# Patient Record
Sex: Female | Born: 1975 | Race: White | Hispanic: No | Marital: Married | Smoking: Never smoker
Health system: Southern US, Community
[De-identification: ages and names within clinical notes are randomized; demographics above are authoritative.]

---

## 2004-01-04 ENCOUNTER — Other Ambulatory Visit: Admission: RE | Admit: 2004-01-04 | Discharge: 2004-01-04 | Payer: Self-pay | Admitting: Physical Therapy

## 2004-01-04 ENCOUNTER — Other Ambulatory Visit: Admission: RE | Admit: 2004-01-04 | Discharge: 2004-01-04 | Payer: Self-pay | Admitting: Obstetrics and Gynecology

## 2004-07-21 ENCOUNTER — Inpatient Hospital Stay (HOSPITAL_COMMUNITY): Admission: AD | Admit: 2004-07-21 | Discharge: 2004-07-23 | Payer: Self-pay | Admitting: Obstetrics and Gynecology

## 2004-08-24 ENCOUNTER — Other Ambulatory Visit: Admission: RE | Admit: 2004-08-24 | Discharge: 2004-08-24 | Payer: Self-pay | Admitting: Obstetrics and Gynecology

## 2005-10-09 ENCOUNTER — Other Ambulatory Visit: Admission: RE | Admit: 2005-10-09 | Discharge: 2005-10-09 | Payer: Self-pay | Admitting: Obstetrics and Gynecology

## 2005-10-16 ENCOUNTER — Ambulatory Visit (HOSPITAL_COMMUNITY): Admission: RE | Admit: 2005-10-16 | Discharge: 2005-10-16 | Payer: Self-pay | Admitting: Obstetrics and Gynecology

## 2005-11-20 ENCOUNTER — Encounter: Admission: RE | Admit: 2005-11-20 | Discharge: 2005-11-20 | Payer: Self-pay | Admitting: *Deleted

## 2005-11-20 ENCOUNTER — Other Ambulatory Visit: Admission: RE | Admit: 2005-11-20 | Discharge: 2005-11-20 | Payer: Self-pay | Admitting: Interventional Radiology

## 2005-11-20 ENCOUNTER — Encounter (INDEPENDENT_AMBULATORY_CARE_PROVIDER_SITE_OTHER): Payer: Self-pay | Admitting: *Deleted

## 2005-12-10 ENCOUNTER — Encounter: Admission: RE | Admit: 2005-12-10 | Discharge: 2005-12-10 | Payer: Self-pay | Admitting: *Deleted

## 2007-02-14 IMAGING — US US BIOPSY
1 series · 6 of 6 positions shown · non-contrast
Comparison: none

CLINICAL DATA: Dominant complex right thyroid lesion. 

ULTRASOUND GUIDED ASPIRATION BIOPSY THYROID:
TECHNIQUE: Overlying skin prepped with Betadine, draped in usual sterile
fashion, and infiltrated locally with 1% lidocaine. Under real-time ultrasound
guidance, 4 passes were made into the dominant right mid pole nodule using 18
and 25 gauge needles, sampling both solid and cystic components. Samples were
given to cytopathology. No immediate complication.

[Series 1: unknown · 0.06mm/px · 6 of 6 slices shown]
[im 1/6]
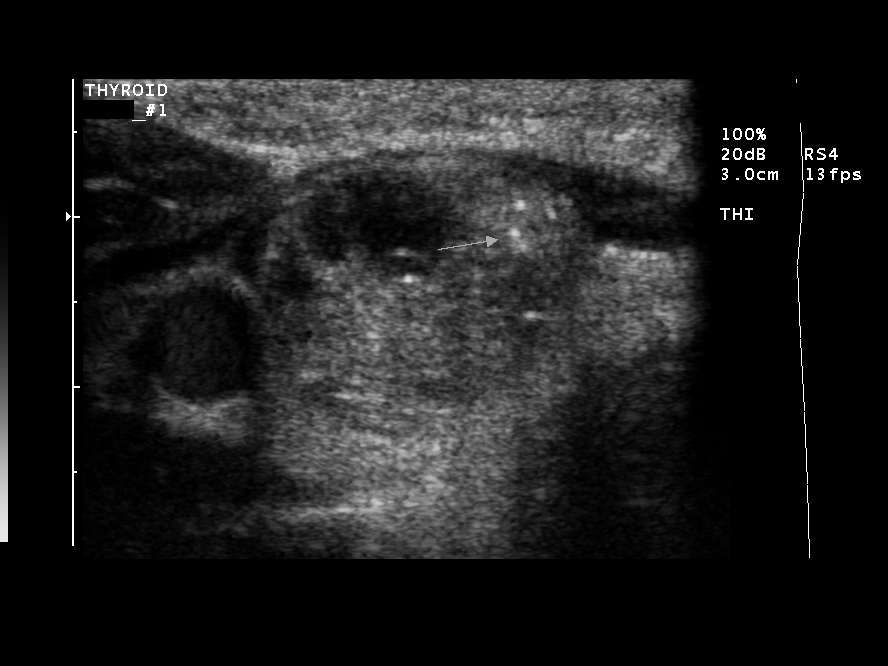
[im 2/6]
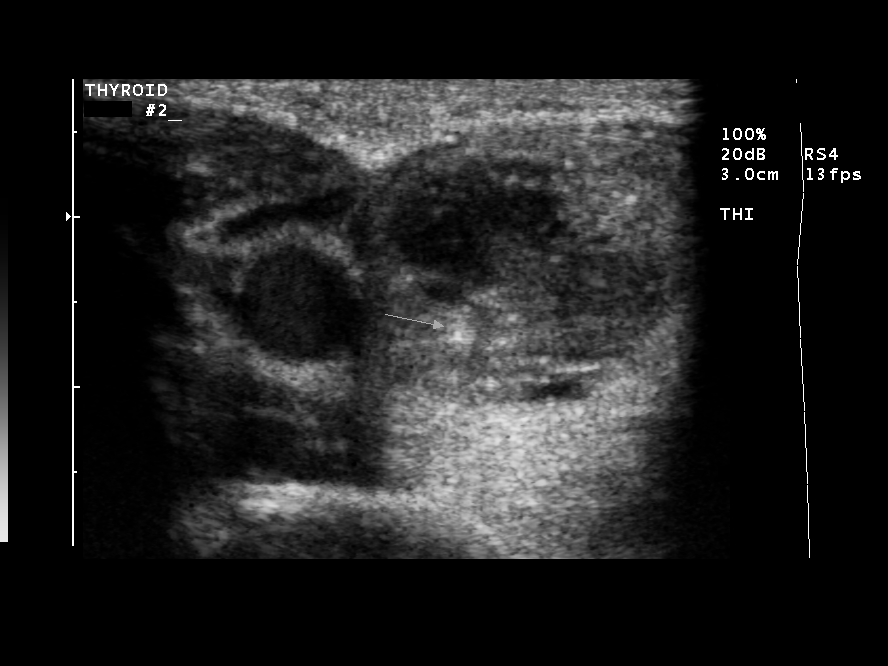
[im 3/6]
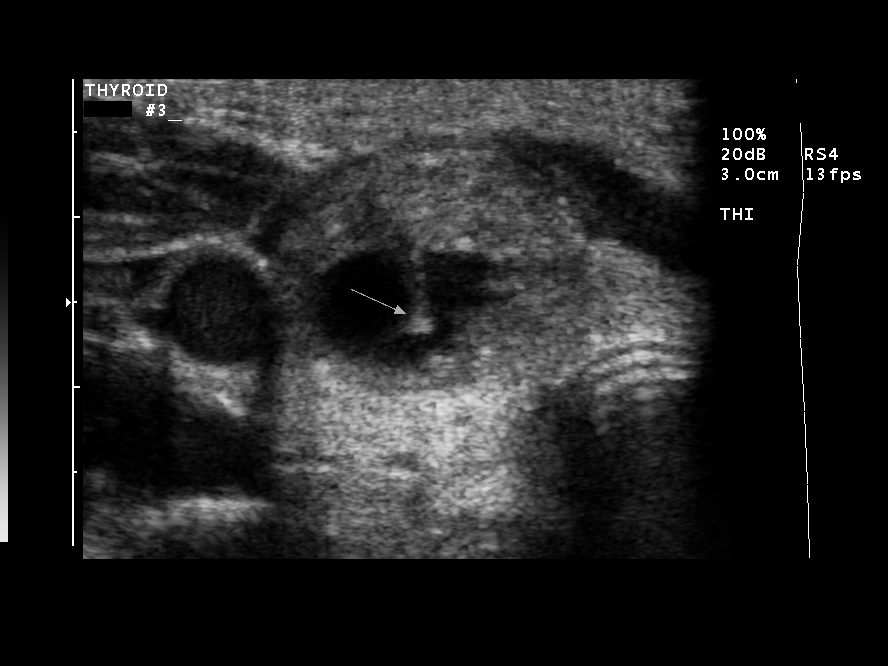
[im 4/6]
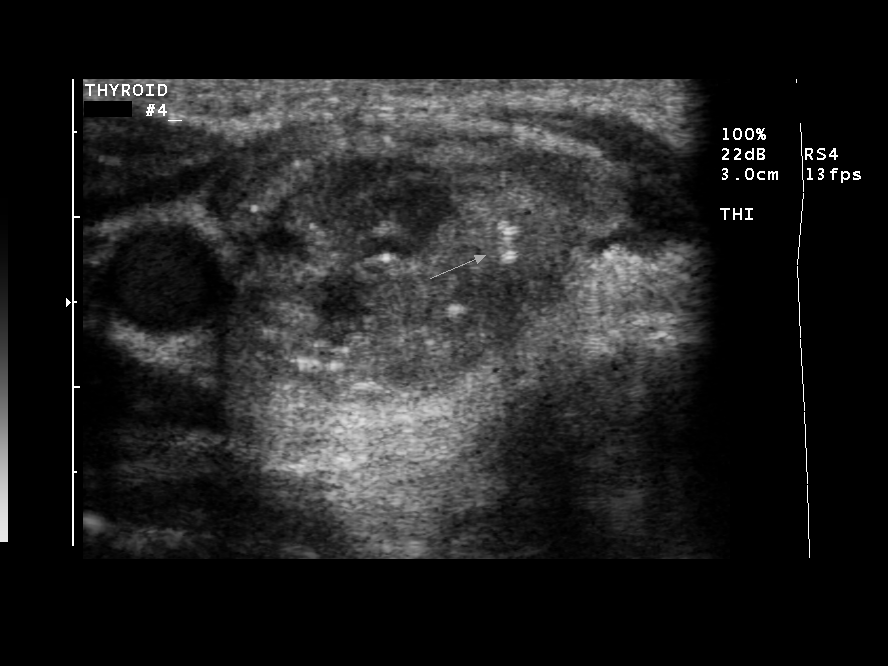
[im 5/6]
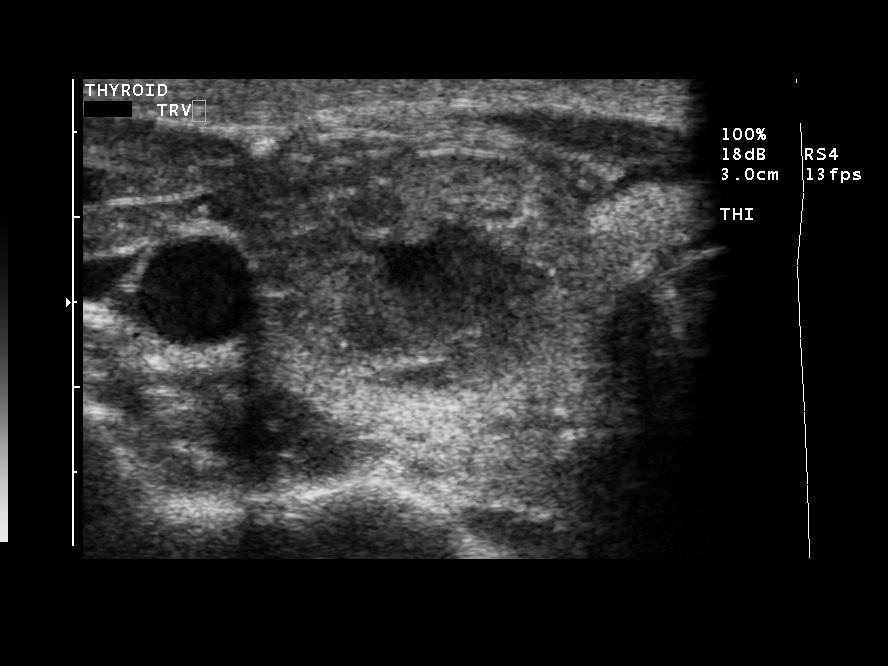
[im 6/6]
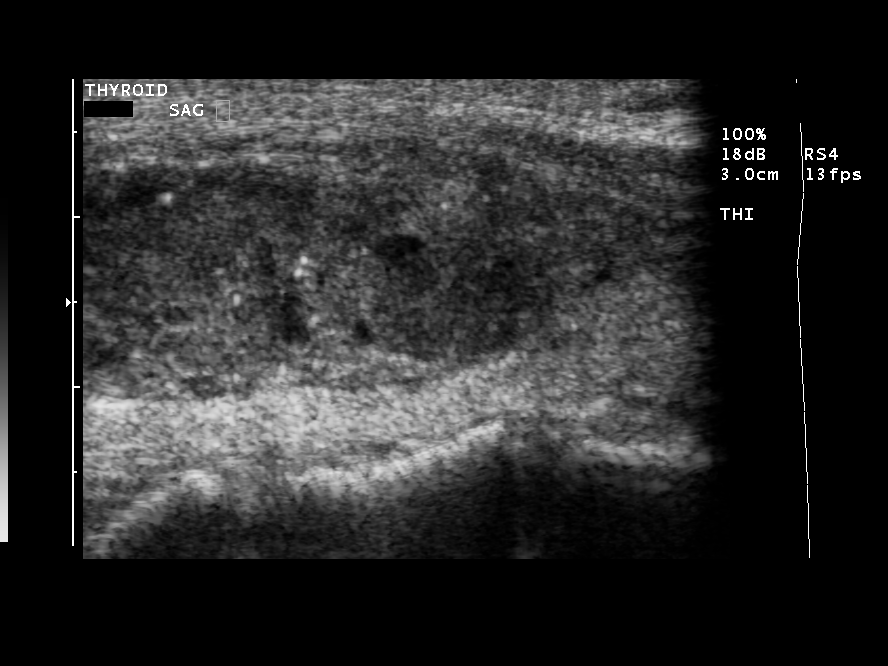

[6 of 6 positions shown; findings below may reference images not displayed]

IMPRESSION: Technically successful ultrasound guided FNA thyroid biopsy.

## 2007-12-10 ENCOUNTER — Ambulatory Visit (HOSPITAL_COMMUNITY): Admission: RE | Admit: 2007-12-10 | Discharge: 2007-12-10 | Payer: Self-pay | Admitting: Obstetrics and Gynecology

## 2013-07-03 ENCOUNTER — Other Ambulatory Visit (HOSPITAL_COMMUNITY): Payer: Self-pay | Admitting: Obstetrics and Gynecology

## 2013-07-03 DIAGNOSIS — E049 Nontoxic goiter, unspecified: Secondary | ICD-10-CM

## 2013-07-07 ENCOUNTER — Ambulatory Visit (HOSPITAL_COMMUNITY)
Admission: RE | Admit: 2013-07-07 | Discharge: 2013-07-07 | Disposition: A | Discharge status: Home / Self Care | Payer: BC Managed Care – PPO | Source: Ambulatory Visit | Attending: Obstetrics and Gynecology | Admitting: Obstetrics and Gynecology

## 2013-07-07 DIAGNOSIS — E049 Nontoxic goiter, unspecified: Secondary | ICD-10-CM

## 2013-07-07 DIAGNOSIS — E041 Nontoxic single thyroid nodule: Secondary | ICD-10-CM | POA: Insufficient documentation

## 2015-06-28 ENCOUNTER — Ambulatory Visit (INDEPENDENT_AMBULATORY_CARE_PROVIDER_SITE_OTHER): Payer: BLUE CROSS/BLUE SHIELD | Admitting: Physician Assistant

## 2015-06-28 VITALS — BP 104/74 | HR 82 | Temp 98.4°F | Resp 16 | Ht 68.5 in | Wt 187.2 lb

## 2015-06-28 DIAGNOSIS — E010 Iodine-deficiency related diffuse (endemic) goiter: Secondary | ICD-10-CM

## 2015-06-28 DIAGNOSIS — E049 Nontoxic goiter, unspecified: Secondary | ICD-10-CM | POA: Diagnosis not present

## 2015-06-28 DIAGNOSIS — Z13220 Encounter for screening for lipoid disorders: Secondary | ICD-10-CM

## 2015-06-28 DIAGNOSIS — Z0000 Encounter for general adult medical examination without abnormal findings: Secondary | ICD-10-CM | POA: Diagnosis not present

## 2015-06-28 DIAGNOSIS — Z131 Encounter for screening for diabetes mellitus: Secondary | ICD-10-CM

## 2015-06-28 LAB — COMPREHENSIVE METABOLIC PANEL
ALT: 11 U/L (ref 6–29)
AST: 13 U/L (ref 10–30)
Albumin: 4.4 g/dL (ref 3.6–5.1)
Alkaline Phosphatase: 75 U/L (ref 33–115)
BUN: 22 mg/dL (ref 7–25)
CO2: 27 mmol/L (ref 20–31)
Calcium: 8.9 mg/dL (ref 8.6–10.2)
Chloride: 104 mmol/L (ref 98–110)
Creat: 0.59 mg/dL (ref 0.50–1.10)
Glucose, Bld: 99 mg/dL (ref 65–99)
Potassium: 4.1 mmol/L (ref 3.5–5.3)
Sodium: 137 mmol/L (ref 135–146)
Total Bilirubin: 0.6 mg/dL (ref 0.2–1.2)
Total Protein: 6.8 g/dL (ref 6.1–8.1)

## 2015-06-28 LAB — LIPID PANEL
Cholesterol: 180 mg/dL (ref 125–200)
HDL: 65 mg/dL (ref >=46)
LDL Cholesterol: 104 mg/dL (ref <130)
Total CHOL/HDL Ratio: 2.8 Ratio (ref <=5.0)
Triglycerides: 57 mg/dL (ref <150)
VLDL: 11 mg/dL (ref <30)

## 2015-06-28 NOTE — Progress Notes (Signed)
[facility] [address], [place] [address] [phone]  Date:  06/28/2015   Name:  Gina Mckinney   DOB:  November 04, 1975   MRN:  [id number]  PCP:  Turner Daniels, MD    Chief Complaint: Annual Exam   History of Present Illness:  This is a 39 y.o. female who is presenting for CPE.  Only PMH is a right thyroid nodule that is followed by GYN. She gets periodic ultrasounds.  Complaints: none LMP: 06/25/15 Contraception: husband had vasectomy Last pap: 06/2013, normal. Going to GYN on 07/14/15. Sexual history: sexually active with husband Immunizations: flu 10/16, tdap 2015 Dentist: regular 6 month visits Eye: no change in vision, had eye exam in past 1 year Diet/Exercise: 3-4 times a week 30 mins of cardio, Hodes she knows she needs to lose some weight Fam hx: mother with uterine cancer dx'd age 61, MGF unknown cancer Tobacco/alcohol/substance use: no/no/no Mammogram: never Colonoscopy: never Dexa: never  She lives with husband and 3 kids. Kids are ages 58,15 and 22. She des not work.  Review of Systems:  Review of Systems  Constitutional: Negative.   HENT: Negative.   Eyes: Negative.   Respiratory: Negative.   Cardiovascular: Negative.   Gastrointestinal: Negative.   Endocrine: Negative.   Genitourinary: Negative.   Musculoskeletal: Negative.   Skin: Negative.   Allergic/Immunologic: Negative.   Neurological: Negative.   Hematological: Negative.   Psychiatric/Behavioral: Negative.     There are no active problems to display for this patient.   Prior to Admission medications   Not on File    No Known Allergies  History reviewed. No pertinent past surgical history.  Social History  Substance Use Topics  . Smoking status: Never Smoker   . Smokeless tobacco: None  . Alcohol Use: No    Family History  Problem Relation Age of Onset  . Cancer Mother     Medication list has been reviewed and updated.  Physical  Examination:  Physical Exam  Constitutional: She is oriented to person, place, and time.  HENT:  Head: Normocephalic and atraumatic.  Right Ear: Hearing, tympanic membrane, external ear and ear canal normal.  Left Ear: Hearing, tympanic membrane, external ear and ear canal normal.  Nose: Nose normal.  Mouth/Throat: Uvula is midline, oropharynx is clear and moist and mucous membranes are normal.  Eyes: Conjunctivae, EOM and lids are normal. Pupils are equal, round, and reactive to light. Right eye exhibits no discharge. Left eye exhibits no discharge. No scleral icterus.  Neck: Trachea normal. Carotid bruit is not present. Thyromegaly (right lobe enlargement) present.  Cardiovascular: Normal rate, regular rhythm, normal heart sounds, intact distal pulses and normal pulses.   No murmur heard. Pulmonary/Chest: Effort normal and breath sounds normal. She has no wheezes. She has no rhonchi. She has no rales.  Abdominal: Soft. Normal appearance. There is no tenderness.  Musculoskeletal: Normal range of motion.  Lymphadenopathy:       Head (right side): No submental, no submandibular, no tonsillar, no preauricular, no posterior auricular and no occipital adenopathy present.       Head (left side): No submental, no submandibular, no tonsillar, no preauricular, no posterior auricular and no occipital adenopathy present.    She has no cervical adenopathy.  Neurological: She is alert and oriented to person, place, and time. She has normal strength and normal reflexes. No cranial nerve deficit or sensory deficit. Coordination and gait normal.  Skin: Skin is warm, dry and intact.  No lesion and no rash noted.  Psychiatric: She has a normal mood and affect. Her speech is normal and behavior is normal. Thought content normal.   BP 104/74 mmHg  Pulse 82  Temp(Src) 98.4 F (36.9 C) (Oral)  Resp 16  Ht 5' 8.5" (1.74 m)  Wt 187 lb 3.2 oz (84.913 kg)  BMI 28.05 kg/m2  SpO2 99%  LMP  06/24/2015  Assessment and Plan:  1. Annual physical exam Form filled out for insurance. Up to date on preventative health screening  2. Lipid screening - Lipid panel  3. Diabetes mellitus screening - Comprehensive metabolic panel  4. Thyromegaly Followed by GYN. Gets periodic neck ultrasounds.   Roswell MinersNicole V. Dyke BrackettBush, PA-C, MHS [facility]  06/28/2015

## 2015-07-02 NOTE — Addendum Note (Signed)
Addended by: Carmelina DaneANDERSON, Mickeal Daws S on: 07/02/2015 03:30 PM   Modules accepted: Kipp BroodSmartSet

## 2016-03-20 DIAGNOSIS — Z1231 Encounter for screening mammogram for malignant neoplasm of breast: Secondary | ICD-10-CM | POA: Diagnosis not present

## 2016-05-16 DIAGNOSIS — Z0000 Encounter for general adult medical examination without abnormal findings: Secondary | ICD-10-CM | POA: Diagnosis not present

## 2016-05-16 DIAGNOSIS — Z23 Encounter for immunization: Secondary | ICD-10-CM | POA: Diagnosis not present

## 2017-03-22 DIAGNOSIS — Z1231 Encounter for screening mammogram for malignant neoplasm of breast: Secondary | ICD-10-CM | POA: Diagnosis not present

## 2017-06-03 DIAGNOSIS — Z0000 Encounter for general adult medical examination without abnormal findings: Secondary | ICD-10-CM | POA: Diagnosis not present

## 2017-06-03 DIAGNOSIS — Z13220 Encounter for screening for lipoid disorders: Secondary | ICD-10-CM | POA: Diagnosis not present

## 2017-06-03 DIAGNOSIS — Z131 Encounter for screening for diabetes mellitus: Secondary | ICD-10-CM | POA: Diagnosis not present

## 2017-06-03 DIAGNOSIS — Z23 Encounter for immunization: Secondary | ICD-10-CM | POA: Diagnosis not present

## 2017-08-22 DIAGNOSIS — Z6826 Body mass index (BMI) 26.0-26.9, adult: Secondary | ICD-10-CM | POA: Diagnosis not present

## 2017-08-22 DIAGNOSIS — Z01419 Encounter for gynecological examination (general) (routine) without abnormal findings: Secondary | ICD-10-CM | POA: Diagnosis not present

## 2018-06-16 DIAGNOSIS — Z13220 Encounter for screening for lipoid disorders: Secondary | ICD-10-CM | POA: Diagnosis not present

## 2018-06-16 DIAGNOSIS — Z0000 Encounter for general adult medical examination without abnormal findings: Secondary | ICD-10-CM | POA: Diagnosis not present

## 2018-08-25 DIAGNOSIS — Z1231 Encounter for screening mammogram for malignant neoplasm of breast: Secondary | ICD-10-CM | POA: Diagnosis not present

## 2018-08-25 DIAGNOSIS — Z6827 Body mass index (BMI) 27.0-27.9, adult: Secondary | ICD-10-CM | POA: Diagnosis not present

## 2018-08-25 DIAGNOSIS — Z01419 Encounter for gynecological examination (general) (routine) without abnormal findings: Secondary | ICD-10-CM | POA: Diagnosis not present

## 2018-09-01 ENCOUNTER — Other Ambulatory Visit: Payer: Self-pay | Admitting: Obstetrics and Gynecology

## 2018-09-01 DIAGNOSIS — R928 Other abnormal and inconclusive findings on diagnostic imaging of breast: Secondary | ICD-10-CM

## 2018-09-11 ENCOUNTER — Ambulatory Visit: Admission: RE | Admit: 2018-09-11 | Payer: Self-pay | Source: Ambulatory Visit

## 2018-09-11 ENCOUNTER — Ambulatory Visit
Admission: RE | Admit: 2018-09-11 | Discharge: 2018-09-11 | Disposition: A | Discharge status: Home / Self Care | Payer: BLUE CROSS/BLUE SHIELD | Source: Ambulatory Visit | Attending: Obstetrics and Gynecology | Admitting: Obstetrics and Gynecology

## 2018-09-11 DIAGNOSIS — R922 Inconclusive mammogram: Secondary | ICD-10-CM | POA: Diagnosis not present

## 2018-09-11 DIAGNOSIS — R928 Other abnormal and inconclusive findings on diagnostic imaging of breast: Secondary | ICD-10-CM

## 2019-10-27 DIAGNOSIS — Z01419 Encounter for gynecological examination (general) (routine) without abnormal findings: Secondary | ICD-10-CM | POA: Diagnosis not present

## 2019-10-27 DIAGNOSIS — Z6826 Body mass index (BMI) 26.0-26.9, adult: Secondary | ICD-10-CM | POA: Diagnosis not present

## 2019-10-27 DIAGNOSIS — Z1231 Encounter for screening mammogram for malignant neoplasm of breast: Secondary | ICD-10-CM | POA: Diagnosis not present

## 2019-10-28 ENCOUNTER — Other Ambulatory Visit: Payer: Self-pay | Admitting: Obstetrics and Gynecology

## 2019-10-28 DIAGNOSIS — R928 Other abnormal and inconclusive findings on diagnostic imaging of breast: Secondary | ICD-10-CM

## 2019-12-06 IMAGING — MG DIGITAL DIAGNOSTIC UNILATERAL LEFT MAMMOGRAM WITH TOMO AND CAD
4 series · 4 of 12 positions shown · non-contrast
Comparison: Previous exams including screening mammograms dated
08/25/2018, 03/22/2017 and 03/20/2016.

CLINICAL DATA: Patient returns today to evaluate a possible LEFT
breast mass.

EXAM:
DIGITAL DIAGNOSTIC UNILATERAL LEFT MAMMOGRAM WITH CAD AND TOMO

[L MLO synth-2D]
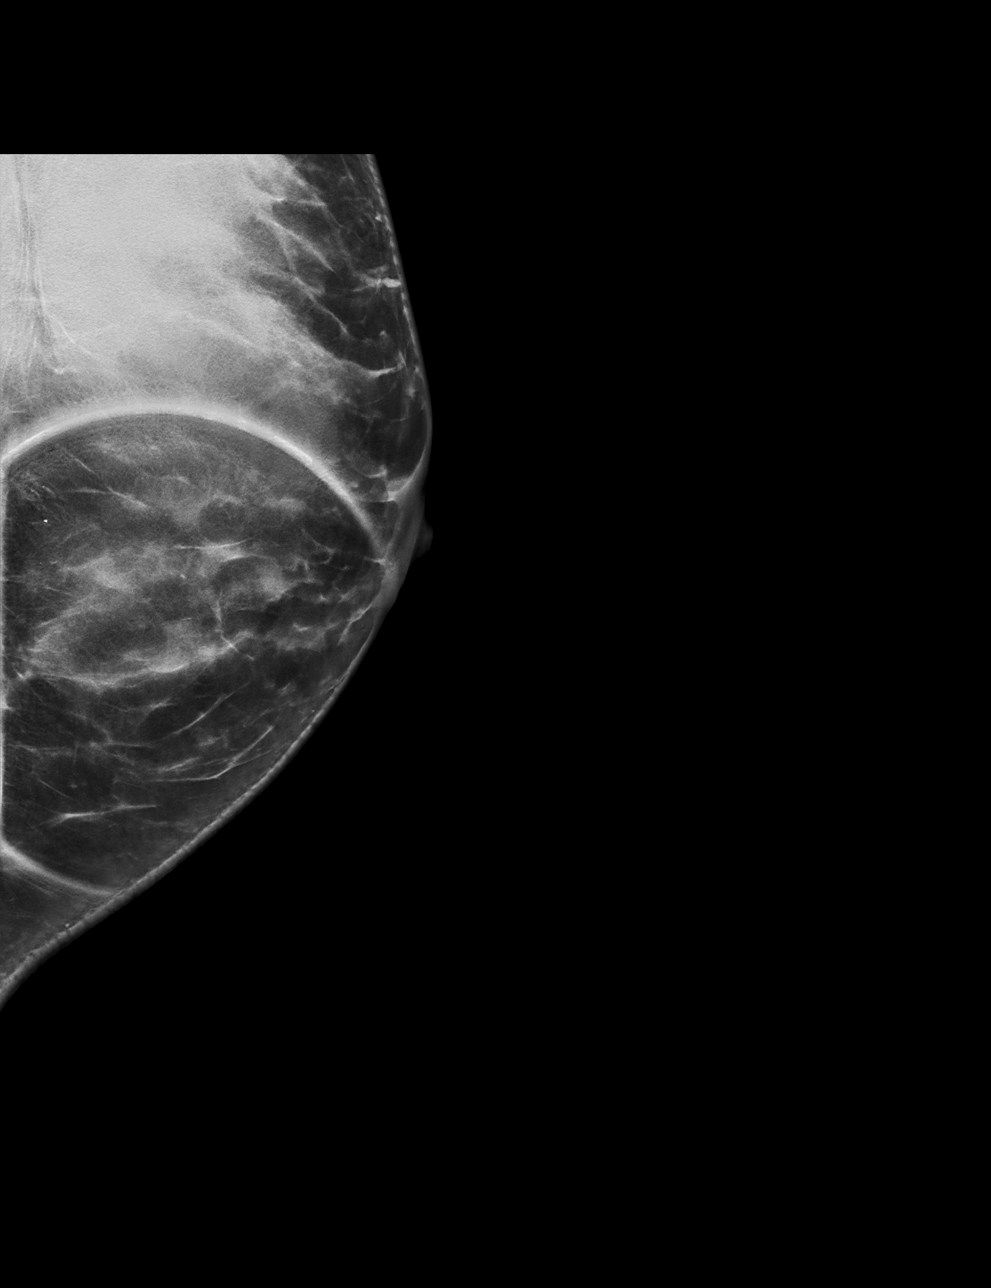

[L CC synth-2D]
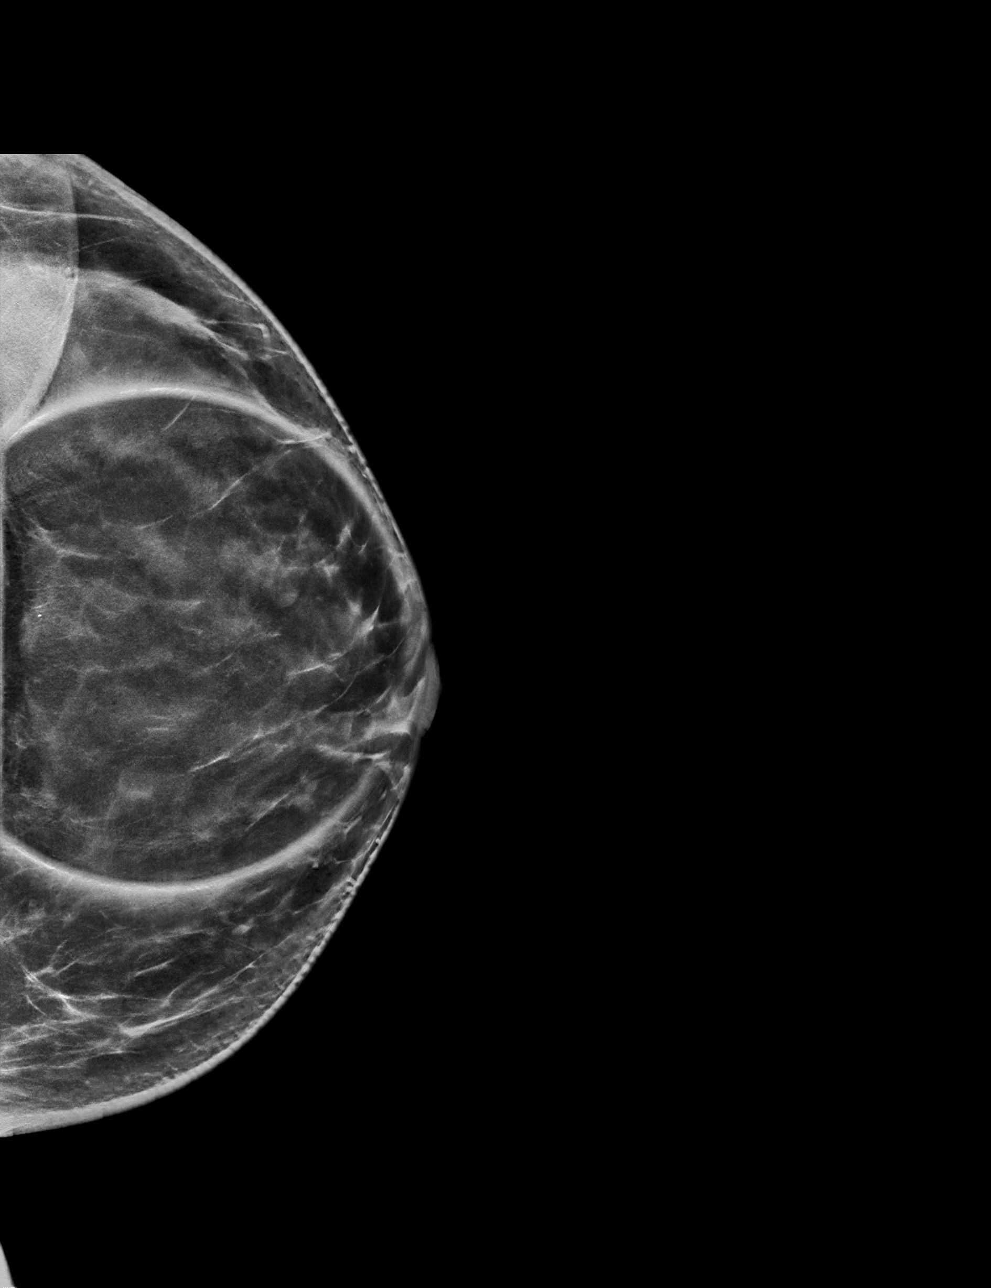

[L CC tomo · tomo slice 38/75.0]
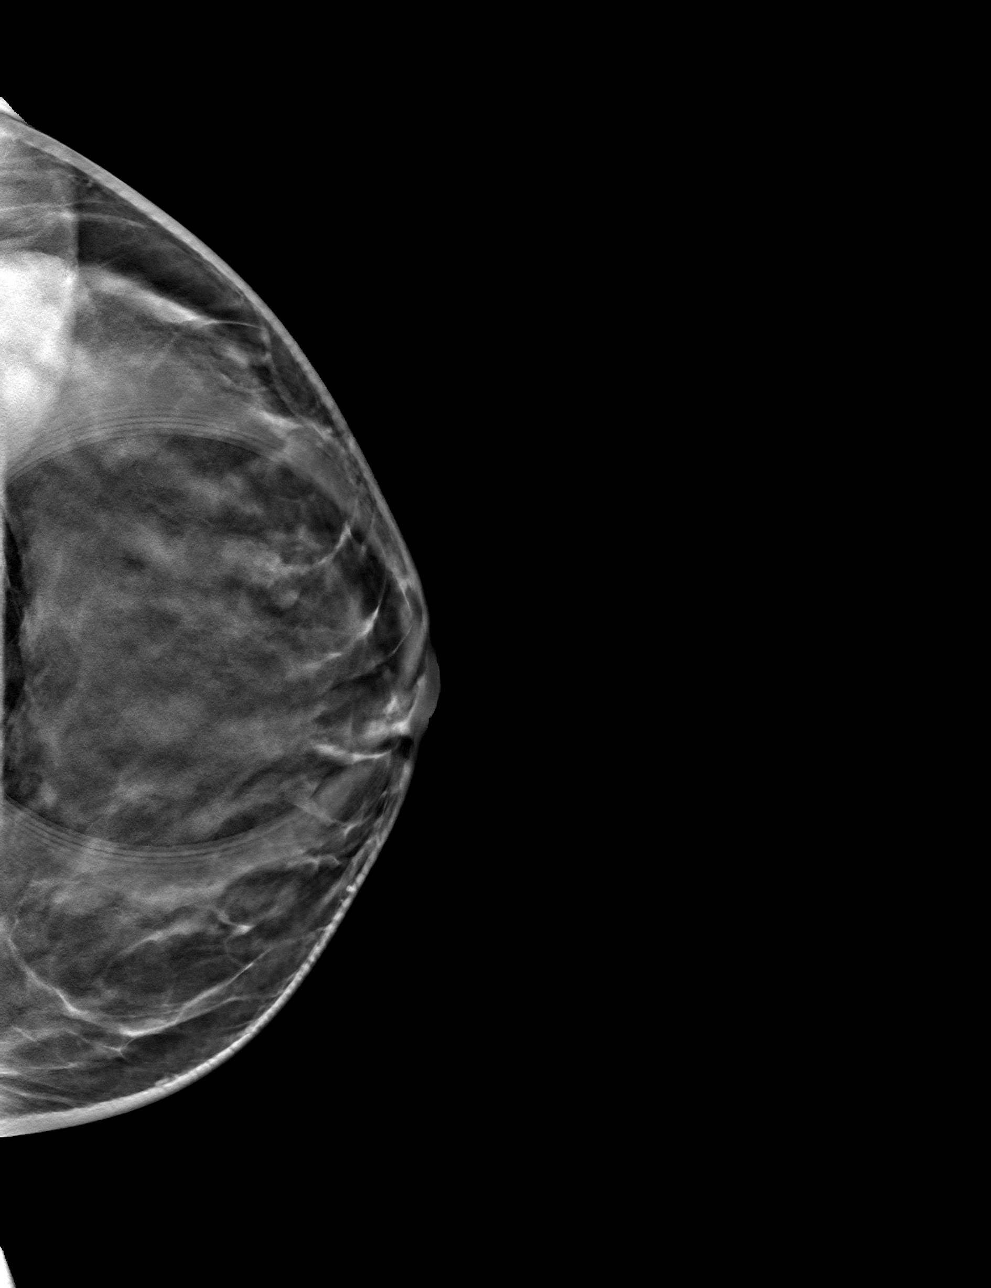

[L MLO tomo · tomo slice 33/65.0]
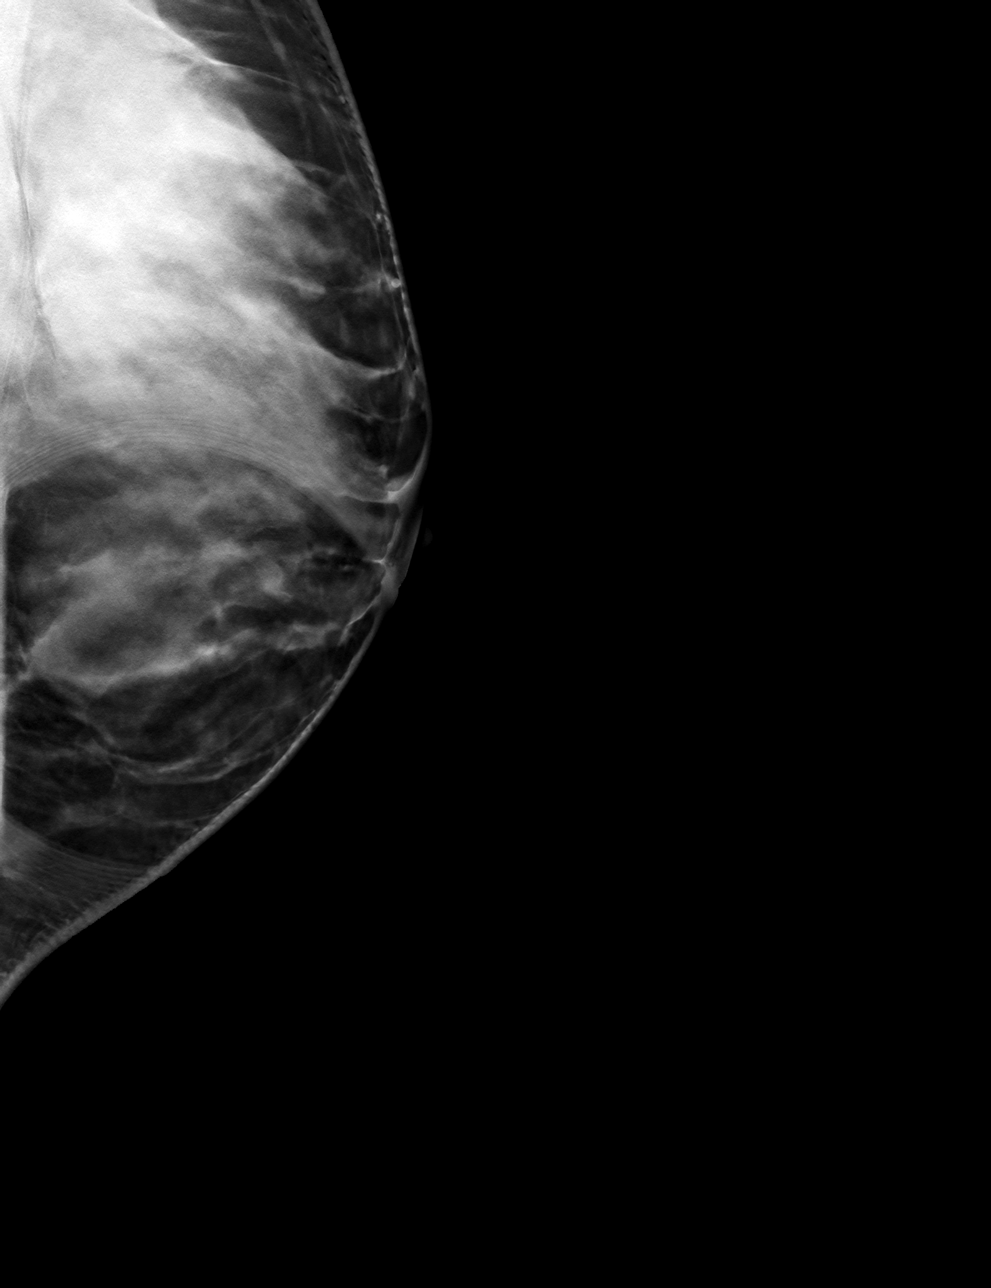

[4 of 12 positions shown; findings below may reference images not displayed]

ACR Breast Density Category d: The breast tissue is extremely dense,
which lowers the sensitivity of mammography.
FINDINGS: On today's additional diagnostic views with spot compression and 3D
tomosynthesis, there is no persistent abnormality within the LEFT
breast indicating superimposition of normal dense fibroglandular
tissues. There are no new dominant masses, suspicious calcifications
or secondary signs of malignancy identified on today's exam.

Mammographic images were processed with CAD.
IMPRESSION: No evidence of malignancy. Patient may return to routine annual
bilateral screening mammogram schedule.

RECOMMENDATION:
Screening mammogram in one year.(Code:H1-8-CRO)

I have discussed the findings and recommendations with the patient.
Results were also provided in writing at the conclusion of the
visit. If applicable, a reminder letter will be sent to the patient
regarding the next appointment.

BI-RADS CATEGORY  1: Negative.

## 2019-12-10 ENCOUNTER — Ambulatory Visit: Payer: Self-pay | Attending: Internal Medicine

## 2019-12-10 DIAGNOSIS — Z23 Encounter for immunization: Secondary | ICD-10-CM

## 2019-12-10 NOTE — Progress Notes (Signed)
   Covid-19 Vaccination Clinic  Name:  Gina Mckinney    MRN: [id number] DOB: Sep 15, 1975  12/10/2019  Ms. [name] was observed post Covid-19 immunization for 15 minutes without incident. She was provided with Vaccine Information Sheet and instruction to access the V-Safe system.   Ms. [name] was instructed to call 911 with any severe reactions post vaccine: Marland Kitchen Difficulty breathing  . Swelling of face and throat  . A fast heartbeat  . A bad rash all over body  . Dizziness and weakness   Immunizations Administered    Name Date Dose VIS Date Route   Moderna COVID-19 Vaccine 12/10/2019 10:07 AM 0.5 mL 08/11/2019 Intramuscular   Manufacturer: Moderna   Lot: [id number]   NDC: 18937-374-96

## 2020-01-13 ENCOUNTER — Ambulatory Visit: Payer: Self-pay | Attending: Internal Medicine

## 2020-01-13 DIAGNOSIS — Z23 Encounter for immunization: Secondary | ICD-10-CM

## 2020-01-13 NOTE — Progress Notes (Signed)
   Covid-19 Vaccination Clinic  Name:  Gina Mckinney    MRN: [id number] DOB: [birth year]  01/13/2020  [name] was observed post Covid-19 immunization for 15 minutes without incident. She was provided with Vaccine Information Sheet and instruction to access the V-Safe system.   [name] was instructed to call 911 with any severe reactions post vaccine: Marland Kitchen Difficulty breathing  . Swelling of face and throat  . A fast heartbeat  . A bad rash all over body  . Dizziness and weakness   Immunizations Administered    Name Date Dose VIS Date Route   Moderna COVID-19 Vaccine 01/13/2020 10:04 AM 0.5 mL 08/2019 Intramuscular   Manufacturer: Moderna   Lot: [id number]   NDC: 87215-872-76

## 2020-06-27 DIAGNOSIS — Z8249 Family history of ischemic heart disease and other diseases of the circulatory system: Secondary | ICD-10-CM | POA: Diagnosis not present

## 2020-06-27 DIAGNOSIS — Z13220 Encounter for screening for lipoid disorders: Secondary | ICD-10-CM | POA: Diagnosis not present

## 2020-06-27 DIAGNOSIS — Z0000 Encounter for general adult medical examination without abnormal findings: Secondary | ICD-10-CM | POA: Diagnosis not present

## 2020-06-27 DIAGNOSIS — Z23 Encounter for immunization: Secondary | ICD-10-CM | POA: Diagnosis not present

## 2020-12-01 DIAGNOSIS — Z1231 Encounter for screening mammogram for malignant neoplasm of breast: Secondary | ICD-10-CM | POA: Diagnosis not present

## 2021-07-03 DIAGNOSIS — Z0000 Encounter for general adult medical examination without abnormal findings: Secondary | ICD-10-CM | POA: Diagnosis not present

## 2021-07-03 DIAGNOSIS — Z13220 Encounter for screening for lipoid disorders: Secondary | ICD-10-CM | POA: Diagnosis not present

## 2021-07-03 DIAGNOSIS — Z23 Encounter for immunization: Secondary | ICD-10-CM | POA: Diagnosis not present

## 2022-01-08 DIAGNOSIS — Z1231 Encounter for screening mammogram for malignant neoplasm of breast: Secondary | ICD-10-CM | POA: Diagnosis not present

## 2023-04-03 DIAGNOSIS — M222X2 Patellofemoral disorders, left knee: Secondary | ICD-10-CM | POA: Diagnosis not present
